# Patient Record
Sex: Male | Born: 1990 | Race: White | Hispanic: No | Marital: Single | Smoking: Current every day smoker
Health system: Southern US, Community
[De-identification: ages and names within clinical notes are randomized; demographics above are authoritative.]

---

## 2014-06-11 ENCOUNTER — Emergency Department (HOSPITAL_COMMUNITY): Payer: Self-pay

## 2014-06-11 ENCOUNTER — Encounter (HOSPITAL_COMMUNITY): Payer: Self-pay | Admitting: Emergency Medicine

## 2014-06-11 ENCOUNTER — Emergency Department (HOSPITAL_COMMUNITY)
Admission: EM | Admit: 2014-06-11 | Discharge: 2014-06-11 | Disposition: A | Discharge status: Home / Self Care | Payer: Self-pay | Attending: Emergency Medicine | Admitting: Emergency Medicine

## 2014-06-11 DIAGNOSIS — Z23 Encounter for immunization: Secondary | ICD-10-CM | POA: Insufficient documentation

## 2014-06-11 DIAGNOSIS — Z720 Tobacco use: Secondary | ICD-10-CM | POA: Insufficient documentation

## 2014-06-11 DIAGNOSIS — Y92410 Unspecified street and highway as the place of occurrence of the external cause: Secondary | ICD-10-CM | POA: Insufficient documentation

## 2014-06-11 DIAGNOSIS — S60512A Abrasion of left hand, initial encounter: Secondary | ICD-10-CM | POA: Insufficient documentation

## 2014-06-11 DIAGNOSIS — S50812A Abrasion of left forearm, initial encounter: Secondary | ICD-10-CM | POA: Insufficient documentation

## 2014-06-11 DIAGNOSIS — T07XXXA Unspecified multiple injuries, initial encounter: Secondary | ICD-10-CM

## 2014-06-11 DIAGNOSIS — S50811A Abrasion of right forearm, initial encounter: Secondary | ICD-10-CM | POA: Insufficient documentation

## 2014-06-11 DIAGNOSIS — S199XXA Unspecified injury of neck, initial encounter: Secondary | ICD-10-CM | POA: Insufficient documentation

## 2014-06-11 DIAGNOSIS — Y998 Other external cause status: Secondary | ICD-10-CM | POA: Insufficient documentation

## 2014-06-11 DIAGNOSIS — Y9389 Activity, other specified: Secondary | ICD-10-CM | POA: Insufficient documentation

## 2014-06-11 DIAGNOSIS — S30811A Abrasion of abdominal wall, initial encounter: Secondary | ICD-10-CM | POA: Insufficient documentation

## 2014-06-11 DIAGNOSIS — S60511A Abrasion of right hand, initial encounter: Secondary | ICD-10-CM | POA: Insufficient documentation

## 2014-06-11 LAB — URINALYSIS, ROUTINE W REFLEX MICROSCOPIC
Bilirubin Urine: NEGATIVE
Glucose, UA: NEGATIVE mg/dL
HGB URINE DIPSTICK: NEGATIVE
KETONES UR: 15 mg/dL — AB
LEUKOCYTES UA: NEGATIVE
NITRITE: NEGATIVE
Protein, ur: NEGATIVE mg/dL
Specific Gravity, Urine: 1.018 (ref 1.005–1.030)
Urobilinogen, UA: 0.2 mg/dL (ref 0.0–1.0)
pH: 6 (ref 5.0–8.0)

## 2014-06-11 LAB — I-STAT CHEM 8, ED
BUN: 16 mg/dL (ref 6–23)
CALCIUM ION: 1.02 mmol/L — AB (ref 1.12–1.23)
CREATININE: 0.8 mg/dL (ref 0.50–1.35)
Chloride: 102 mmol/L (ref 96–112)
GLUCOSE: 158 mg/dL — AB (ref 70–99)
HCT: 46 % (ref 39.0–52.0)
Hemoglobin: 15.6 g/dL (ref 13.0–17.0)
Potassium: 3.5 mmol/L (ref 3.5–5.1)
SODIUM: 137 mmol/L (ref 135–145)
TCO2: 19 mmol/L (ref 0–100)

## 2014-06-11 MED ORDER — NAPROXEN 500 MG PO TABS: 500.0000 mg | ORAL_TABLET | Freq: Two times a day (BID) | ORAL | Status: DC

## 2014-06-11 MED ORDER — HYDROCODONE-ACETAMINOPHEN 5-325 MG PO TABS
1.0000 | ORAL_TABLET | Freq: Once | ORAL | Status: AC
  Administered 2014-06-11: 1 via ORAL
  Filled 2014-06-11: qty 1

## 2014-06-11 MED ORDER — IPRATROPIUM-ALBUTEROL 0.5-2.5 (3) MG/3ML IN SOLN
3.0000 mL | Freq: Once | RESPIRATORY_TRACT | Status: AC
  Administered 2014-06-11: 3 mL via RESPIRATORY_TRACT
  Filled 2014-06-11: qty 3

## 2014-06-11 MED ORDER — IBUPROFEN 800 MG PO TABS
800.0000 mg | ORAL_TABLET | Freq: Once | ORAL | Status: AC
  Administered 2014-06-11: 800 mg via ORAL
  Filled 2014-06-11: qty 1

## 2014-06-11 MED ORDER — IOHEXOL 300 MG/ML  SOLN
100.0000 mL | Freq: Once | INTRAMUSCULAR | Status: AC | PRN
  Administered 2014-06-11: 100 mL via INTRAVENOUS

## 2014-06-11 MED ORDER — TETANUS-DIPHTH-ACELL PERTUSSIS 5-2.5-18.5 LF-MCG/0.5 IM SUSP
0.5000 mL | Freq: Once | INTRAMUSCULAR | Status: AC
  Administered 2014-06-11: 0.5 mL via INTRAMUSCULAR
  Filled 2014-06-11: qty 0.5

## 2014-06-11 NOTE — ED Notes (Signed)
Pt was able to ambulate for 15 feet without assistance.

## 2014-06-11 NOTE — Discharge Instructions (Signed)
Contusion Your tests are negative for a serious injury. Use the antiinflammatories as prescribed. Return to the ED if you develop new or worsening symptoms.  A contusion is a deep bruise. Contusions are the result of an injury that caused bleeding under the skin. The contusion may turn blue, purple, or yellow. Minor injuries will give you a painless contusion, but more severe contusions may stay painful and swollen for a few weeks.  CAUSES  A contusion is usually caused by a blow, trauma, or direct force to an area of the body. SYMPTOMS   Swelling and redness of the injured area.  Bruising of the injured area.  Tenderness and soreness of the injured area.  Pain. DIAGNOSIS  The diagnosis can be made by taking a history and physical exam. An X-ray, CT scan, or MRI may be needed to determine if there were any associated injuries, such as fractures. TREATMENT  Specific treatment will depend on what area of the body was injured. In general, the best treatment for a contusion is resting, icing, elevating, and applying cold compresses to the injured area. Over-the-counter medicines may also be recommended for pain control. Ask your caregiver what the best treatment is for your contusion. HOME CARE INSTRUCTIONS   Put ice on the injured area.  Put ice in a plastic bag.  Place a towel between your skin and the bag.  Leave the ice on for 15-20 minutes, 3-4 times a day, or as directed by your health care provider.  Only take over-the-counter or prescription medicines for pain, discomfort, or fever as directed by your caregiver. Your caregiver may recommend avoiding anti-inflammatory medicines (aspirin, ibuprofen, and naproxen) for 48 hours because these medicines may increase bruising.  Rest the injured area.  If possible, elevate the injured area to reduce swelling. SEEK IMMEDIATE MEDICAL CARE IF:   You have increased bruising or swelling.  You have pain that is getting worse.  Your  swelling or pain is not relieved with medicines. MAKE SURE YOU:   Understand these instructions.  Will watch your condition.  Will get help right away if you are not doing well or get worse. Document Released: 11/21/2004 Document Revised: 02/16/2013 Document Reviewed: 12/17/2010 [facility] Patient Information 2015 [place], [place]. This information is not intended to replace advice given to you by your health care provider. Make sure you discuss any questions you have with your health care provider.
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
## 2016-04-25 ENCOUNTER — Emergency Department (HOSPITAL_COMMUNITY)
Admission: EM | Admit: 2016-04-25 | Discharge: 2016-04-25 | Disposition: A | Discharge status: Home / Self Care | Payer: Medicaid Other | Attending: Emergency Medicine | Admitting: Emergency Medicine

## 2016-04-25 ENCOUNTER — Encounter (HOSPITAL_COMMUNITY): Payer: Self-pay | Admitting: Emergency Medicine

## 2016-04-25 ENCOUNTER — Emergency Department (HOSPITAL_COMMUNITY): Payer: Medicaid Other

## 2016-04-25 DIAGNOSIS — Z7982 Long term (current) use of aspirin: Secondary | ICD-10-CM | POA: Diagnosis not present

## 2016-04-25 DIAGNOSIS — F17210 Nicotine dependence, cigarettes, uncomplicated: Secondary | ICD-10-CM | POA: Insufficient documentation

## 2016-04-25 DIAGNOSIS — M898X6 Other specified disorders of bone, lower leg: Secondary | ICD-10-CM

## 2016-04-25 DIAGNOSIS — M79662 Pain in left lower leg: Secondary | ICD-10-CM | POA: Diagnosis present

## 2016-04-25 DIAGNOSIS — Z79899 Other long term (current) drug therapy: Secondary | ICD-10-CM | POA: Diagnosis not present

## 2016-04-25 MED ORDER — ACETAMINOPHEN 325 MG PO TABS
650.0000 mg | ORAL_TABLET | Freq: Once | ORAL | Status: AC
  Administered 2016-04-25: 650 mg via ORAL
  Filled 2016-04-25: qty 2

## 2016-04-25 MED ORDER — IBUPROFEN 200 MG PO TABS
600.0000 mg | ORAL_TABLET | Freq: Once | ORAL | Status: AC
  Administered 2016-04-25: 600 mg via ORAL
  Filled 2016-04-25: qty 3

## 2016-04-25 MED ORDER — ALBUTEROL SULFATE HFA 108 (90 BASE) MCG/ACT IN AERS
2.0000 | INHALATION_SPRAY | Freq: Once | RESPIRATORY_TRACT | Status: AC
  Administered 2016-04-25: 2 via RESPIRATORY_TRACT
  Filled 2016-04-25: qty 6.7

## 2016-04-25 MED ORDER — IBUPROFEN 400 MG PO TABS: 400.0000 mg | ORAL_TABLET | Freq: Three times a day (TID) | ORAL | 0 refills | Status: DC | PRN

## 2016-04-25 NOTE — ED Notes (Signed)
ED Provider at bedside. 

## 2016-04-25 NOTE — ED Provider Notes (Signed)
[facility]-EMERGENCY DEPT Provider Note   CSN: [id number] Arrival date & time: 04/25/16  0903     History   Chief Complaint Chief Complaint  Patient presents with  . Leg Pain    HPI Brady Lopez is a 26 y.o. male.  HPI Patient presents to the emergency department with left distal anterior tibia and ankle pain.  He states his been worse with bearing weight over the past 5 days.  He denies weakness or numbness in his foot.  No known injury.  No trauma.  No fevers or chills.  No discoloration or redness noted.  Patient feels like he might be slightly swollen in his anterior tibia.  No other complaints or symptoms are mild in severity   History reviewed. No pertinent past medical history.  There are no active problems to display for this patient.   History reviewed. No pertinent surgical history.     Home Medications    Prior to Admission medications   Medication Sig Start Date End Date Taking? Authorizing Provider  albuterol (PROVENTIL HFA;VENTOLIN HFA) 108 (90 Base) MCG/ACT inhaler Inhale 2 puffs into the lungs every 4 (four) hours as needed for wheezing or shortness of breath.   Yes Historical Provider, MD  aspirin-acetaminophen-caffeine (EXCEDRIN EXTRA STRENGTH) [phone] MG tablet Take 4-5 tablets by mouth every 4 (four) hours as needed for headache or migraine.   Yes Historical Provider, MD  Fluticasone-Salmeterol (ADVAIR) 250-50 MCG/DOSE AEPB Inhale 1 puff into the lungs 2 (two) times daily.   Yes Historical Provider, MD  ibuprofen (ADVIL,MOTRIN) 400 MG tablet Take 1 tablet (400 mg total) by mouth every 8 (eight) hours as needed. 04/25/16   Azalia Bilis, MD    Family History No family history on file.  Social History Social History  Substance Use Topics  . Smoking status: Current Every Day Smoker    Packs/day: 2.00    Types: Cigarettes  . Smokeless tobacco: Never Used  . Alcohol use Yes     Comment: rarely     Allergies   Tramadol   Review of Systems Review  of Systems  All other systems reviewed and are negative.    Physical Exam Updated Vital Signs BP 126/86 (BP Location: Left Arm)   Pulse 78   Temp 98.2 F (36.8 C) (Oral)   Resp 16   SpO2 99%   Physical Exam  Constitutional: He is oriented to person, place, and time. He appears well-developed and well-nourished.  HENT:  Head: Normocephalic.  Eyes: EOM are normal.  Neck: Normal range of motion.  Pulmonary/Chest: Effort normal.  Abdominal: He exhibits no distension.  Musculoskeletal: Normal range of motion.  Mild distal anterior tibial tenderness without erythema or fluctuance.  Normal PT and DP pulse in left foot.  No swelling of the left lower extreme venous compared to the right.  Legs are symmetric.  Full range of motion of left ankle and left knee.  Mild pain elicited with dorsiflexion of the left ankle.  Neurological: He is alert and oriented to person, place, and time.  Psychiatric: He has a normal mood and affect.  Nursing note and vitals reviewed.    ED Treatments / Results  Labs (all labs ordered are listed, but only abnormal results are displayed) Labs Reviewed - No data to display  EKG  EKG Interpretation None       Radiology Dg Tibia/fibula Left  Result Date: 04/25/2016 CLINICAL DATA:  LEFT leg pain. Worse with weight-bearing. No known trauma. EXAM: LEFT TIBIA AND FIBULA -  2 VIEW COMPARISON:  None. FINDINGS: There is no evidence of fracture or other focal bone lesions. Soft tissues are unremarkable. IMPRESSION: Negative. Electronically Signed   By: Elsie StainJohn T Curnes M.D.   On: 04/25/2016 10:00    Procedures Procedures (including critical care time)  Medications Ordered in ED Medications  ibuprofen (ADVIL,MOTRIN) tablet 600 mg (600 mg Oral Given 04/25/16 1025)  acetaminophen (TYLENOL) tablet 650 mg (650 mg Oral Given 04/25/16 1025)  albuterol (PROVENTIL HFA;VENTOLIN HFA) 108 (90 Base) MCG/ACT inhaler 2 puff (2 puffs Inhalation Given 04/25/16 1026)     Initial  Impression / Assessment and Plan / ED Course  I have reviewed the triage vital signs and the nursing notes.  Pertinent labs & imaging results that were available during my care of the patient were reviewed by me and considered in my medical decision making (see chart for details).     Suspect tendinitis.  X-ray negative.  Home with anti-inflammatories and rest.  Primary care follow-up.  Patient requests a refill of his albuterol inhaler.  He was given an albuterol MDI in the ER  Final Clinical Impressions(s) / ED Diagnoses   Final diagnoses:  Pain in left tibia    New Prescriptions New Prescriptions   IBUPROFEN (ADVIL,MOTRIN) 400 MG TABLET    Take 1 tablet (400 mg total) by mouth every 8 (eight) hours as needed.     Azalia BilisKevin Pavle Wiler, MD 04/25/16 1047

## 2016-04-25 NOTE — ED Triage Notes (Signed)
Pt c/o left shin pain radiating to ankle, edema, worse with weight bearing onset 5 days ago. Pulling and ripping sensation when moving a certain way or driving. Anterior lower leg and foot painful to touch, visible swollen mass to anterior leg overlying tibia.

## 2017-06-03 ENCOUNTER — Emergency Department (HOSPITAL_COMMUNITY): Admission: EM | Admit: 2017-06-03 | Discharge: 2017-06-03 | Discharge status: Against Medical Advice | Payer: Self-pay

## 2017-06-03 NOTE — ED Notes (Signed)
Patient has been called by both RNs and this tech. No answer each time.

## 2017-06-03 NOTE — ED Notes (Signed)
Patient called for triage with no response.  

## 2018-10-23 IMAGING — CR DG TIBIA/FIBULA 2V*L*
4 series · 4 of 4 positions shown · non-contrast
Comparison: None.

CLINICAL DATA: LEFT leg pain. Worse with weight-bearing. No known
trauma.

EXAM:
LEFT TIBIA AND FIBULA - 2 VIEW

[x tib-fib ap left (1 of 2)]
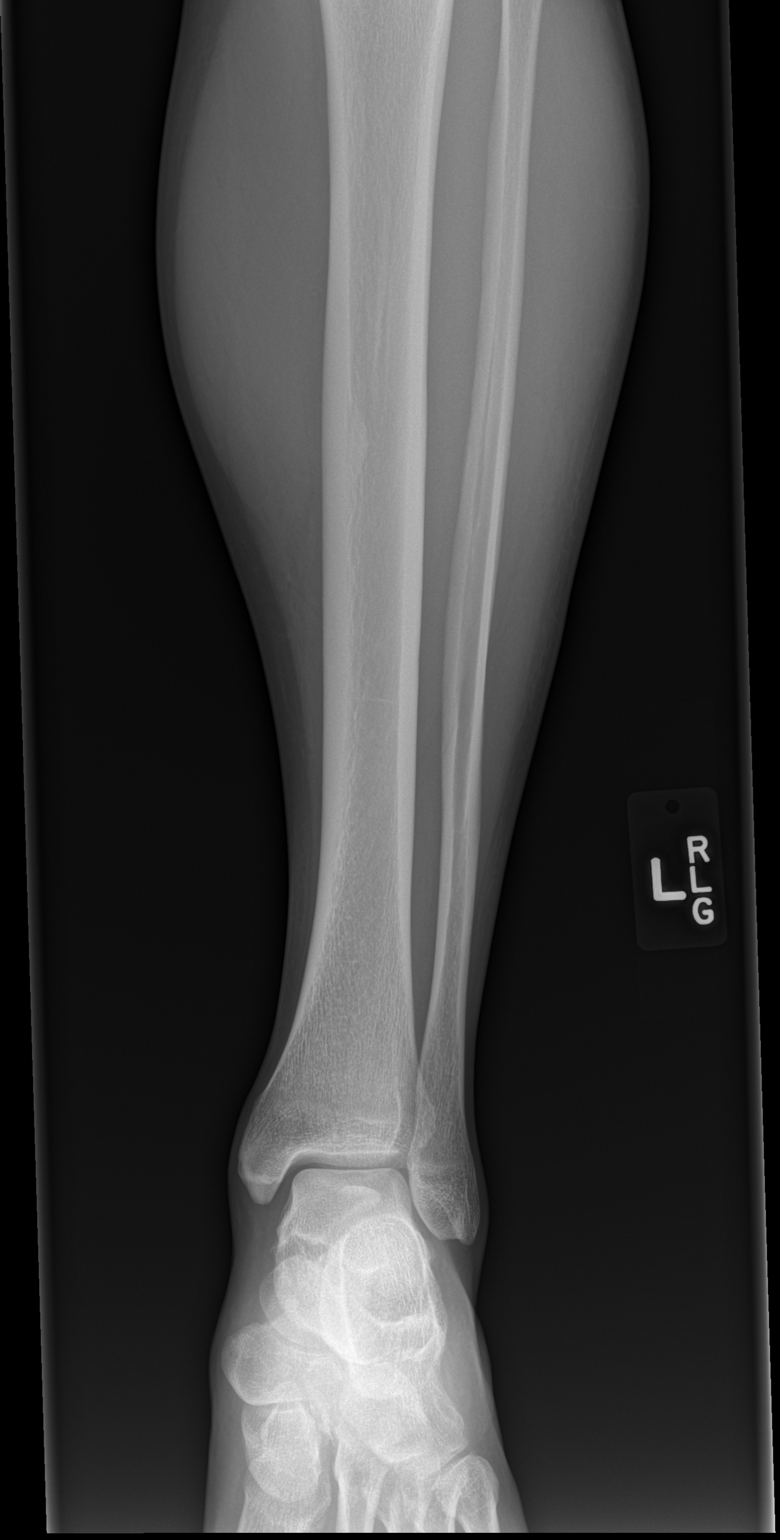

[x tib-fib ap left (2 of 2)]
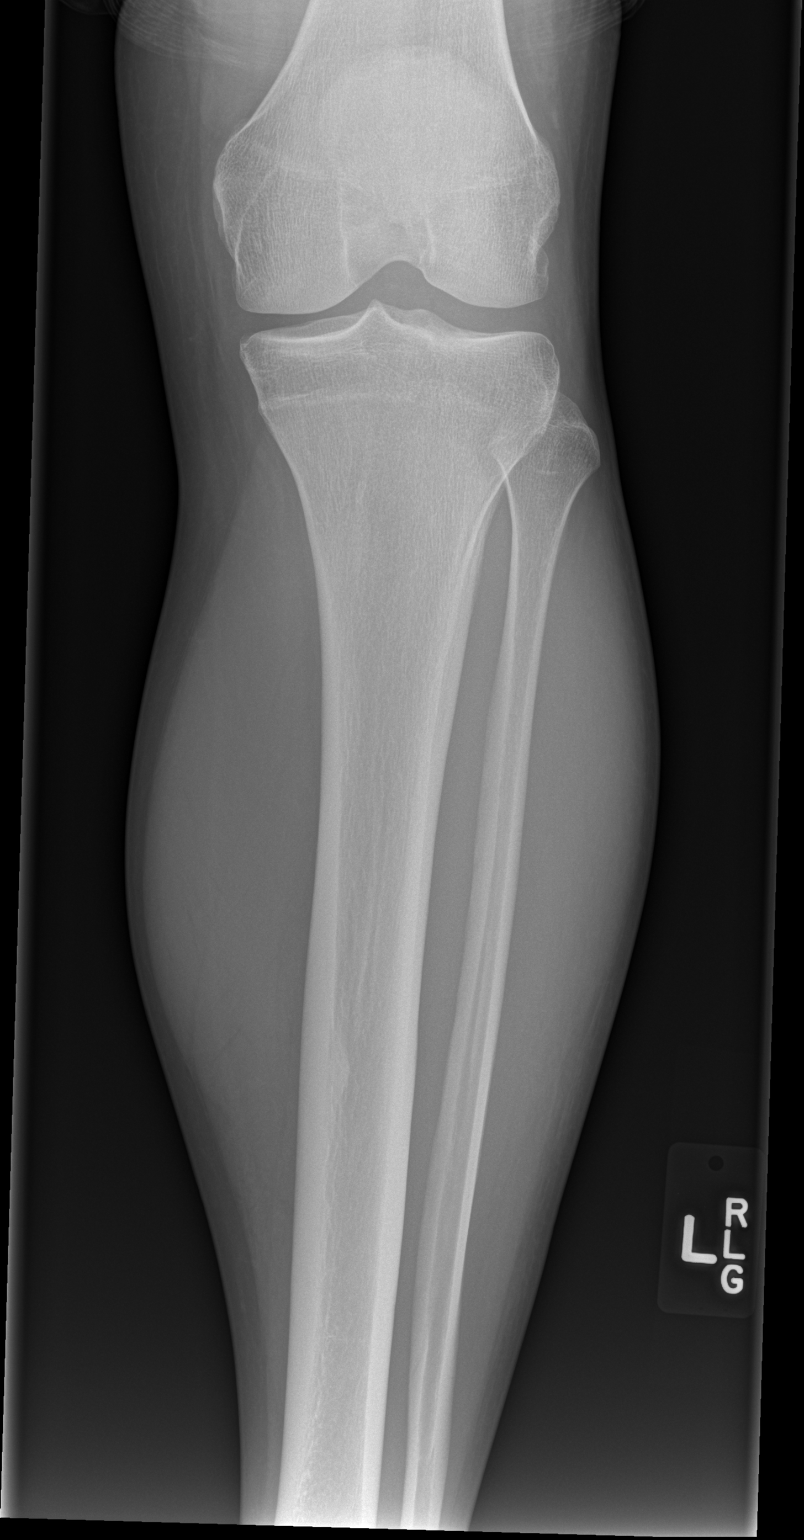

[x tib-fib lat left (1 of 2)]
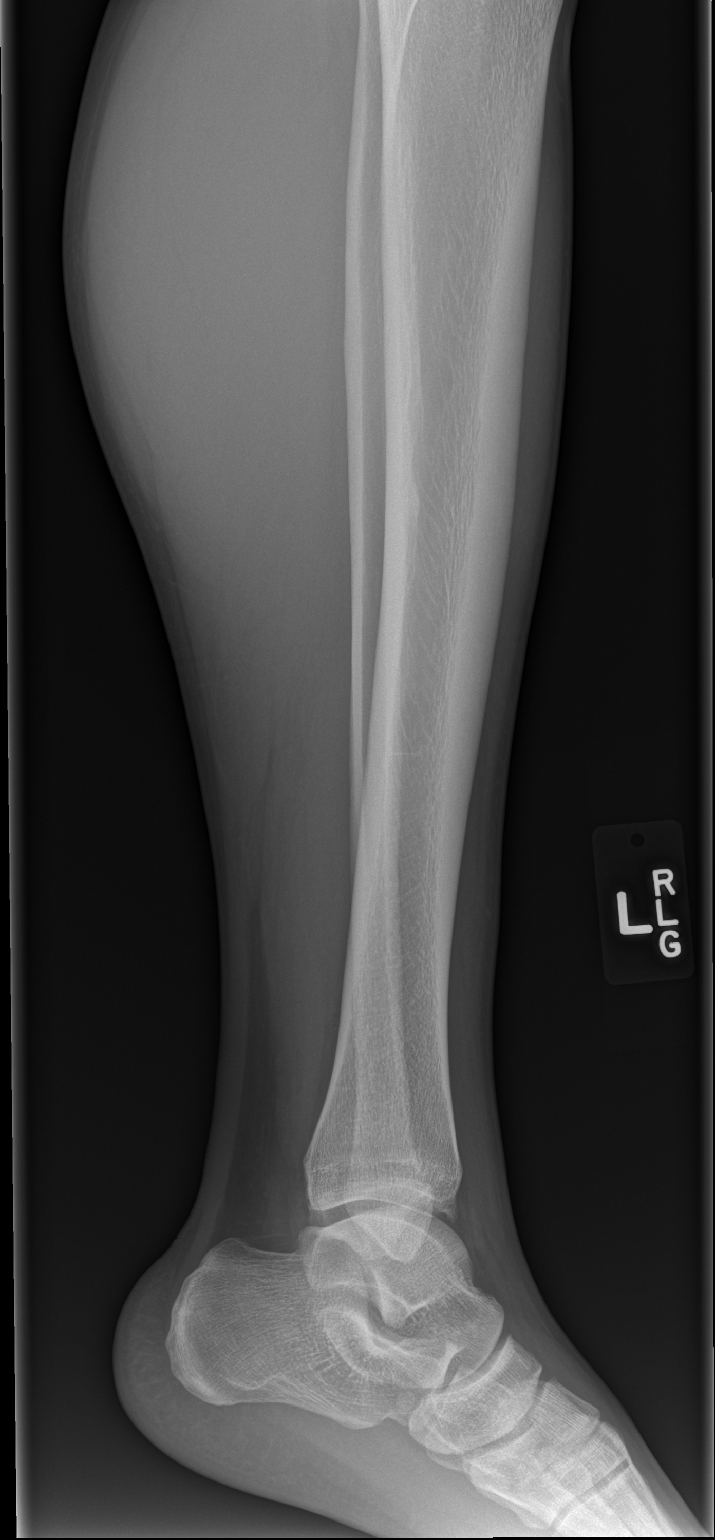

[x tib-fib lat left (2 of 2)]
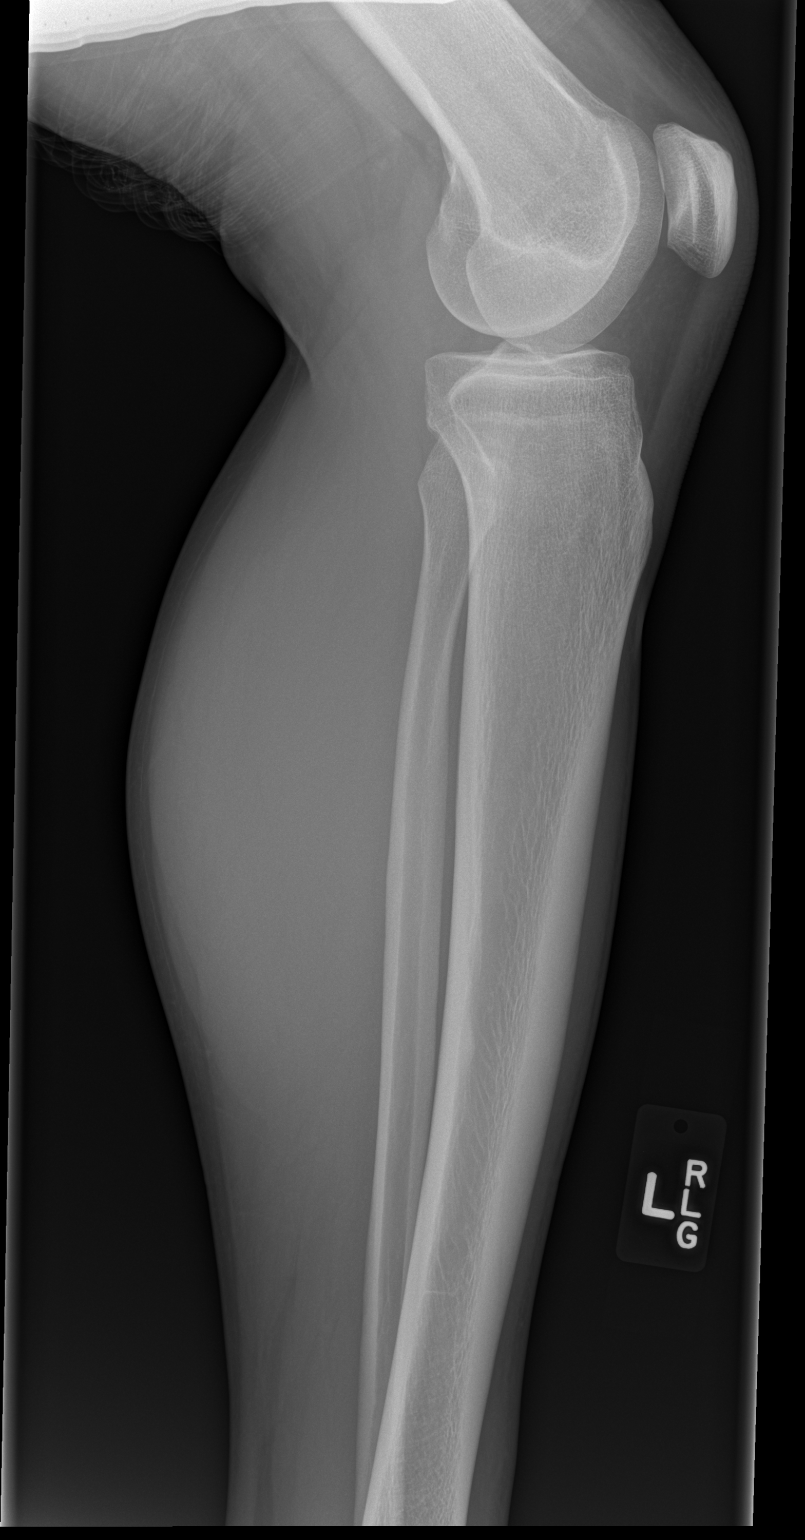

[4 of 4 positions shown; findings below may reference images not displayed]

FINDINGS: There is no evidence of fracture or other focal bone lesions. Soft
tissues are unremarkable.
IMPRESSION: Negative.

## 2023-09-30 ENCOUNTER — Encounter (HOSPITAL_BASED_OUTPATIENT_CLINIC_OR_DEPARTMENT_OTHER): Payer: Self-pay

## 2023-09-30 ENCOUNTER — Other Ambulatory Visit: Payer: Self-pay

## 2023-09-30 ENCOUNTER — Emergency Department (HOSPITAL_BASED_OUTPATIENT_CLINIC_OR_DEPARTMENT_OTHER)
Admission: EM | Admit: 2023-09-30 | Discharge: 2023-09-30 | Disposition: A | Discharge status: Home / Self Care | Payer: Self-pay | Attending: Emergency Medicine | Admitting: Emergency Medicine

## 2023-09-30 DIAGNOSIS — N492 Inflammatory disorders of scrotum: Secondary | ICD-10-CM | POA: Insufficient documentation

## 2023-09-30 DIAGNOSIS — L0291 Cutaneous abscess, unspecified: Secondary | ICD-10-CM

## 2023-09-30 DIAGNOSIS — Z7982 Long term (current) use of aspirin: Secondary | ICD-10-CM | POA: Insufficient documentation

## 2023-09-30 MED ORDER — SULFAMETHOXAZOLE-TRIMETHOPRIM 800-160 MG PO TABS: 1.0000 | ORAL_TABLET | Freq: Two times a day (BID) | ORAL | 0 refills | Status: AC

## 2023-09-30 MED ORDER — IBUPROFEN 800 MG PO TABS: 800.0000 mg | ORAL_TABLET | Freq: Three times a day (TID) | ORAL | 0 refills | Status: AC

## 2023-09-30 NOTE — ED Triage Notes (Signed)
 Reports abscess under scrotum for 3 days. States it popped yesterday and had pus coming out. States pain is worsening.

## 2023-09-30 NOTE — ED Notes (Signed)
 Pt has been using topical ointment and Sitz baths for the abscess below his scrotum. Popped recently and still currently draining.

## 2023-09-30 NOTE — ED Notes (Signed)
 Chaperoned for provider during evaluation.

## 2023-09-30 NOTE — Discharge Instructions (Addendum)
 You are given a prescription for antibiotics in order to help treat your infection, please take 1 tablet twice a day for the next 7 days.  You are also given a prescription for ibuprofen , you may take this medication to help with pain.

## 2023-09-30 NOTE — ED Provider Notes (Signed)
 [facility] Provider Note   CSN: [id number] Arrival date & time: 09/30/23  1046     Patient presents with: Abscess   Brady Lopez is a 33 y.o. male.   33 year old male with a past medical history of MRSA presents to the ED with a chief complaint of abscess to the left side of his scrotum for the past 3 days.  Patient reports that he works at [employer], is in extreme heat when his work hours, does wear tight pants for this.  Reports worsening pain to the left side of his scrotum for the past 3 days, has been applying warm compresses to the area without much improvement in his symptoms.  He has had a prior history of abscesses however never in this region.  He has not complaining of any abdominal pain, fever, chills, nausea, vomiting, difficulty urinating.  The history is provided by the patient.  Abscess Associated symptoms: no fever        Prior to Admission medications   Medication Sig Start Date End Date Taking? Authorizing Provider  ibuprofen  (ADVIL ) 800 MG tablet Take 1 tablet (800 mg total) by mouth 3 (three) times daily for 7 days. 09/30/23 10/07/23 Yes Taitum Menton, PA-C  sulfamethoxazole -trimethoprim  (BACTRIM  DS) 800-160 MG tablet Take 1 tablet by mouth 2 (two) times daily for 7 days. 09/30/23 10/07/23 Yes Tabbitha Janvrin, PA-C  albuterol  (PROVENTIL  HFA;VENTOLIN  HFA) 108 (90 Base) MCG/ACT inhaler Inhale 2 puffs into the lungs every 4 (four) hours as needed for wheezing or shortness of breath.    [provider]  aspirin-acetaminophen -caffeine (EXCEDRIN EXTRA STRENGTH) 250-250-65 MG tablet Take 4-5 tablets by mouth every 4 (four) hours as needed for headache or migraine.    [provider]  Fluticasone-Salmeterol (ADVAIR) 250-50 MCG/DOSE AEPB Inhale 1 puff into the lungs 2 (two) times daily.    [provider]    Allergies: Tramadol    Review of Systems  Constitutional:  Negative for chills and fever.   Respiratory:  Negative for shortness of breath.   Skin:  Positive for wound.  All other systems reviewed and are negative.   Updated Vital Signs BP (!) 136/95 (BP Location: Right Arm)   Pulse 87   Temp 97.8 F (36.6 C)   Resp 16   Wt 90.7 kg   SpO2 100%   BMI 31.32 kg/m   Physical Exam Vitals and nursing note reviewed. Exam conducted with a chaperone present.  Constitutional:      Appearance: Normal appearance.  HENT:     Nose: Nose normal.  Cardiovascular:     Rate and Rhythm: Normal rate.  Pulmonary:     Effort: Pulmonary effort is normal.  Abdominal:     General: Abdomen is flat.  Genitourinary:    Comments: Chaperoned by [employer] paramedic.  Small actively draining abscess to low the left scrotum area, no tracking, no induration tracking to the anus. Musculoskeletal:     Cervical back: Normal range of motion and neck supple.  Skin:    General: Skin is warm and dry.  Neurological:     Mental Status: He is alert and oriented to person, place, and time.     (all labs ordered are listed, but only abnormal results are displayed) Labs Reviewed - No data to display  EKG: None  Radiology: No results found.   Procedures   Medications Ordered in the ED - No data to display  Medical Decision Making    Patient presented the ED with a chief complaint of abscess to the left side of his scrotum for the past 4 days.  Reports there has not been any improvement in his pain, he does work in hot temperatures and does wear work clothes that are somewhat tight.  This abscess is actively draining, chaperoned by [facility] paramedic.  There is no tracking or streaking in the skin tracking into the anus, I do not feel that there is a deep pocket of infection.  He does not have any systemic signs such as fever, chills, other complaints.  Abscess does appear minimal in size.  I did discuss with him short course of antibiotics.  According to prior visits  he does have a history of MRSA, we will place him on a short course of Bactrim  for 7 days to help with infection.  Him and significant other agreeable to plan and treatment, hemodynamically stable for discharge.   Portions of this note were generated with Scientist, clinical (histocompatibility and immunogenetics). Dictation errors may occur despite best attempts at proofreading.   Final diagnoses:  Abscess    ED Discharge Orders          Ordered    sulfamethoxazole -trimethoprim  (BACTRIM  DS) 800-160 MG tablet  2 times daily        09/30/23 1217    ibuprofen  (ADVIL ) 800 MG tablet  3 times daily        09/30/23 1217               Warren Lindahl, PA-C 09/30/23 1220    Elnor Savant A, DO 09/30/23 1604
